# Patient Record
Sex: Male | Born: 1985 | Race: White | Hispanic: No | Marital: Married
Health system: Southern US, Community
[De-identification: ages and names within clinical notes are randomized; demographics above are authoritative.]

---
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
## 2004-08-18 ENCOUNTER — Emergency Department (HOSPITAL_COMMUNITY): Admission: EM | Admit: 2004-08-18 | Discharge: 2004-08-18 | Payer: Self-pay | Admitting: Emergency Medicine

## 2005-02-25 ENCOUNTER — Emergency Department (HOSPITAL_COMMUNITY): Admission: EM | Admit: 2005-02-25 | Discharge: 2005-02-25 | Payer: Self-pay | Admitting: Emergency Medicine

## 2005-07-24 ENCOUNTER — Emergency Department (HOSPITAL_COMMUNITY): Admission: EM | Admit: 2005-07-24 | Discharge: 2005-07-24 | Payer: Self-pay | Admitting: Emergency Medicine

## 2008-12-21 ENCOUNTER — Emergency Department (HOSPITAL_COMMUNITY): Admission: EM | Admit: 2008-12-21 | Discharge: 2008-12-21 | Payer: Self-pay | Admitting: Emergency Medicine

## 2009-01-07 ENCOUNTER — Encounter: Admission: RE | Admit: 2009-01-07 | Discharge: 2009-01-07 | Payer: Self-pay | Admitting: Family Medicine

## 2009-10-03 ENCOUNTER — Ambulatory Visit (HOSPITAL_COMMUNITY): Admission: EM | Admit: 2009-10-03 | Discharge: 2009-10-04 | Payer: Self-pay | Admitting: Emergency Medicine

## 2009-10-03 ENCOUNTER — Encounter (INDEPENDENT_AMBULATORY_CARE_PROVIDER_SITE_OTHER): Payer: Self-pay

## 2010-05-24 LAB — URINE MICROSCOPIC-ADD ON

## 2010-05-24 LAB — URINALYSIS, ROUTINE W REFLEX MICROSCOPIC
Bilirubin Urine: NEGATIVE
Glucose, UA: NEGATIVE mg/dL
Hgb urine dipstick: NEGATIVE
Ketones, ur: 15 mg/dL — AB
Nitrite: NEGATIVE
Urobilinogen, UA: 1 mg/dL (ref 0.0–1.0)
pH: 8.5 — ABNORMAL HIGH (ref 5.0–8.0)

## 2010-05-24 LAB — CBC
Hemoglobin: 14.7 g/dL (ref 13.0–17.0)
RBC: 4.72 MIL/uL (ref 4.22–5.81)
WBC: 14.8 10*3/uL — ABNORMAL HIGH (ref 4.0–10.5)

## 2010-05-24 LAB — COMPREHENSIVE METABOLIC PANEL
ALT: 20 U/L (ref 0–53)
Albumin: 4.4 g/dL (ref 3.5–5.2)
Chloride: 106 mEq/L (ref 96–112)
Potassium: 4.2 mEq/L (ref 3.5–5.1)
Sodium: 138 mEq/L (ref 135–145)

## 2010-05-24 LAB — DIFFERENTIAL
Basophils Relative: 0 % (ref 0–1)
Eosinophils Absolute: 0 10*3/uL (ref 0.0–0.7)
Eosinophils Relative: 0 % (ref 0–5)
Lymphocytes Relative: 10 % — ABNORMAL LOW (ref 12–46)
Monocytes Relative: 4 % (ref 3–12)
Neutrophils Relative %: 85 % — ABNORMAL HIGH (ref 43–77)

## 2010-06-12 LAB — POCT I-STAT, CHEM 8
Calcium, Ion: 1.07 mmol/L — ABNORMAL LOW (ref 1.12–1.32)
Creatinine, Ser: 1.2 mg/dL (ref 0.4–1.5)
Glucose, Bld: 93 mg/dL (ref 70–99)
HCT: 42 % (ref 39.0–52.0)
Hemoglobin: 14.3 g/dL (ref 13.0–17.0)
TCO2: 23 mmol/L (ref 0–100)

## 2010-06-12 LAB — CBC
HCT: 39.9 % (ref 39.0–52.0)
MCHC: 34.5 g/dL (ref 30.0–36.0)
MCV: 90.6 fL (ref 78.0–100.0)
Platelets: 194 10*3/uL (ref 150–400)
RBC: 4.41 MIL/uL (ref 4.22–5.81)
RDW: 13.1 % (ref 11.5–15.5)

## 2010-06-12 LAB — PROTIME-INR: INR: 1.04 (ref 0.00–1.49)

## 2012-05-01 IMAGING — CT CT ABD-PELV W/ CM
2 of 4 series · 17 of 46 positions shown, 19 images · IV contrast (APPLIED)
Comparison: None.

CLINICAL DATA: Epigastric pain.  Nausea vomiting since this a.m.

CT ABDOMEN AND PELVIS WITH CONTRAST
TECHNIQUE: Multidetector CT imaging of the abdomen and pelvis was
performed following the standard protocol during bolus
administration of intravenous contrast.
Contrast: 100  ml 3mnipaque-9MM

[Series 2: abd/pelv with 5.0 b31f st · axial · 0.69mm/px · z∈[-455,-70]mm · 14 of 85 slices shown, 16 images]
[im 4/85  soft-tissue]
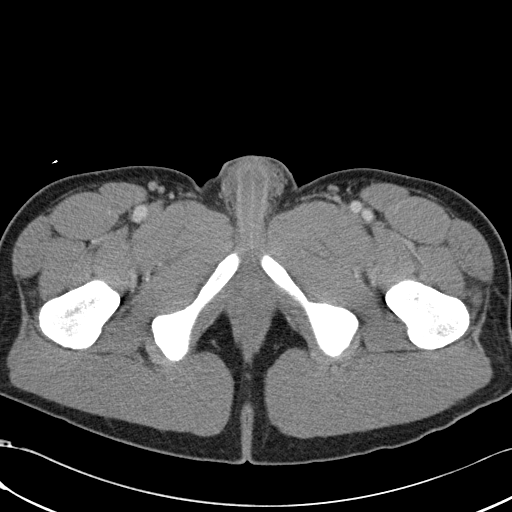
[im 4/85  bone]
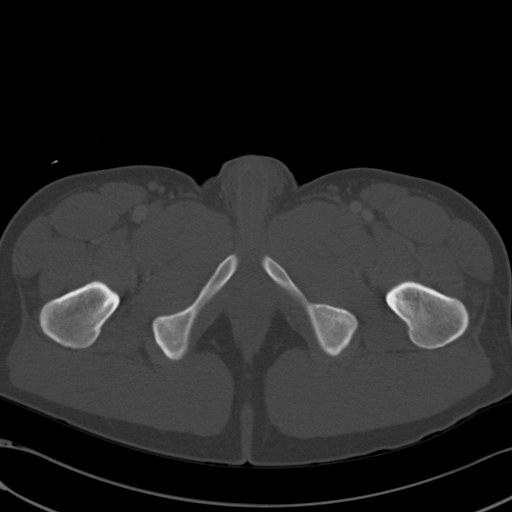
[im 11/85  soft-tissue]
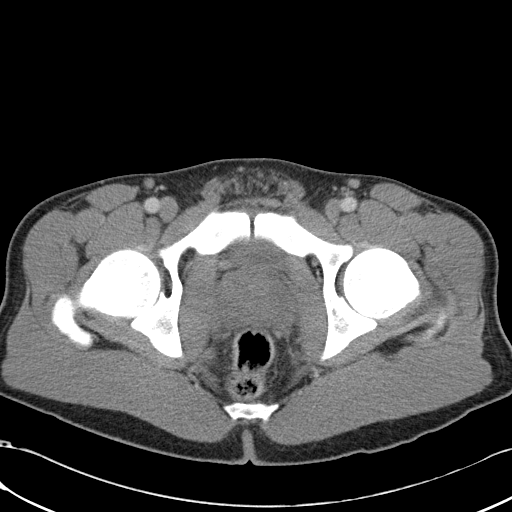
[im 17/85  soft-tissue]
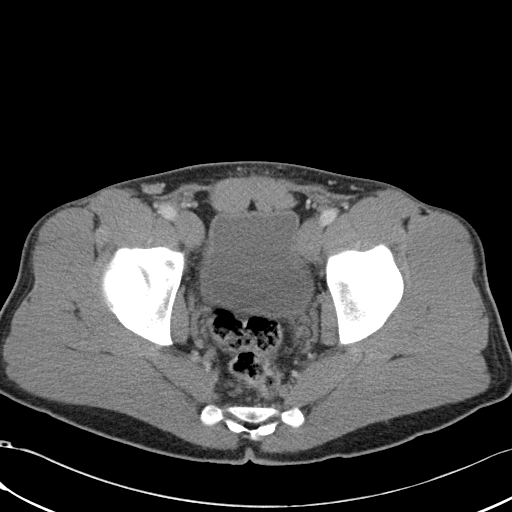
[im 24/85  soft-tissue]
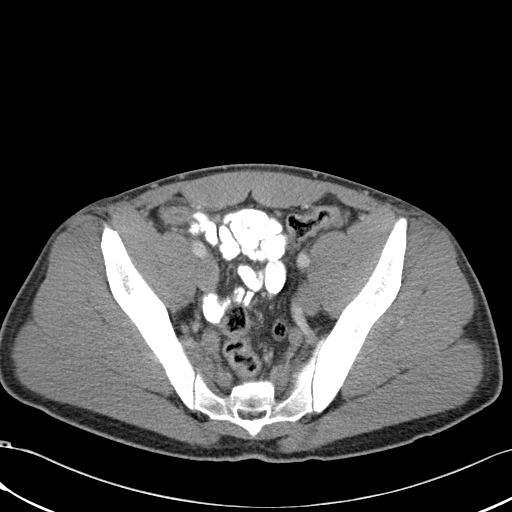
[im 27/85  soft-tissue]
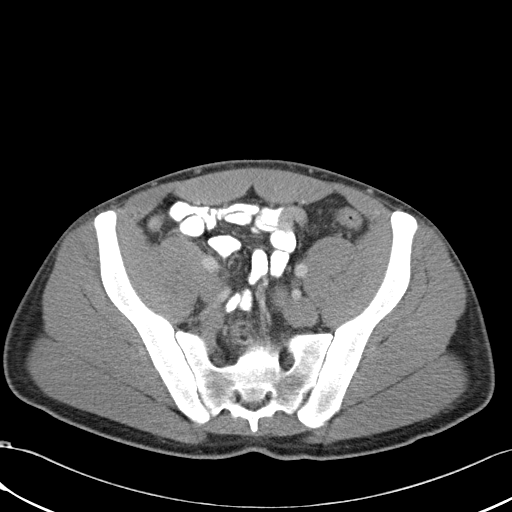
[im 34/85  soft-tissue]
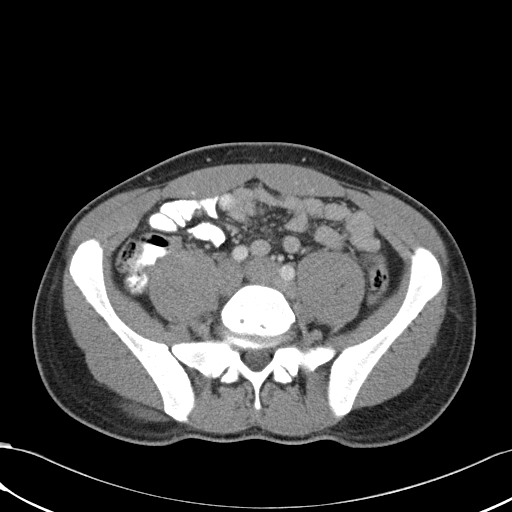
[im 41/85  soft-tissue]
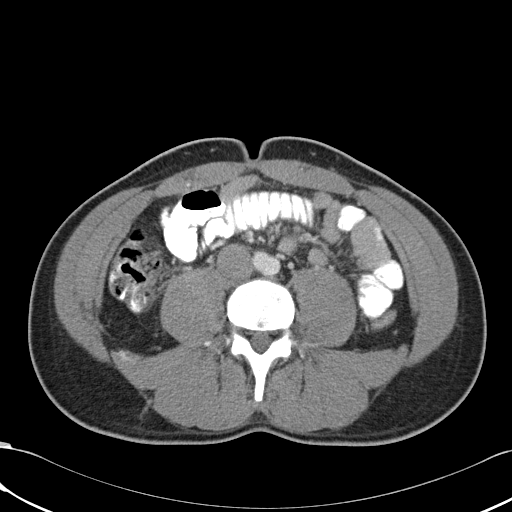
[im 44/85  soft-tissue]
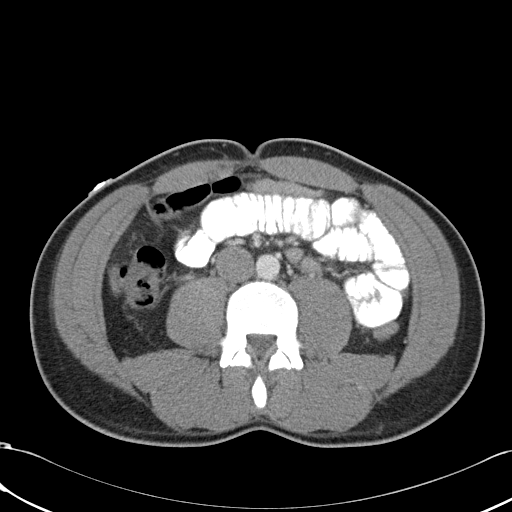
[im 51/85  soft-tissue]
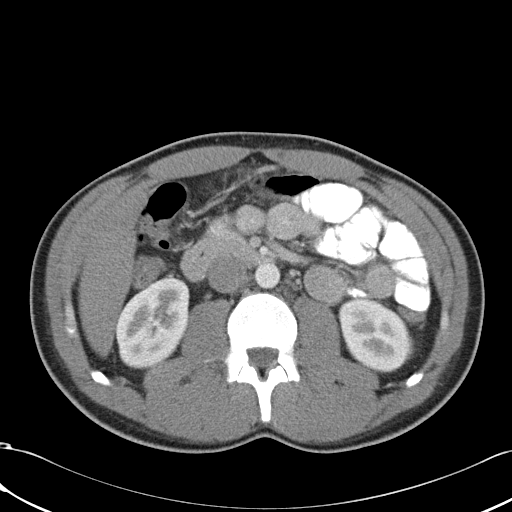
[im 51/85  bone]
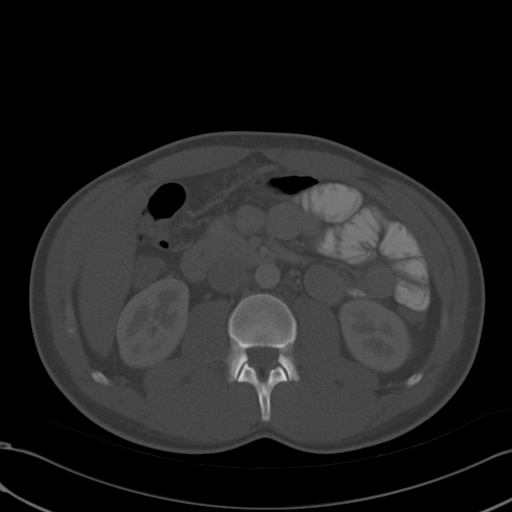
[im 58/85  soft-tissue]
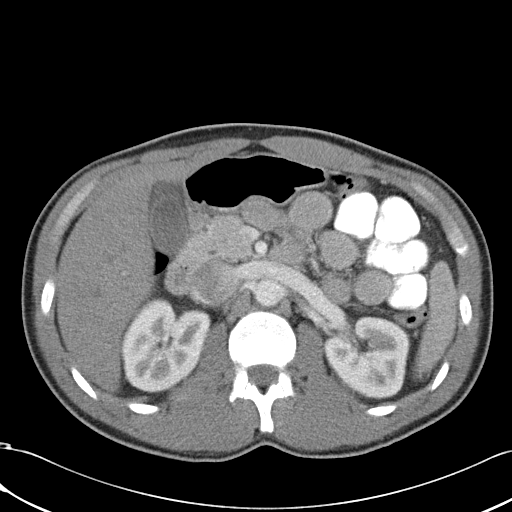
[im 64/85  soft-tissue]
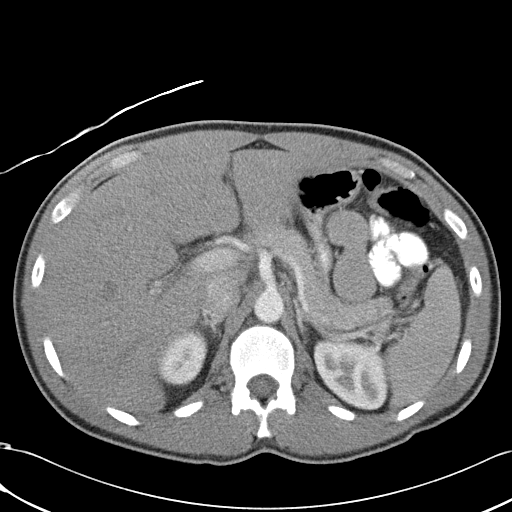
[im 68/85  soft-tissue]
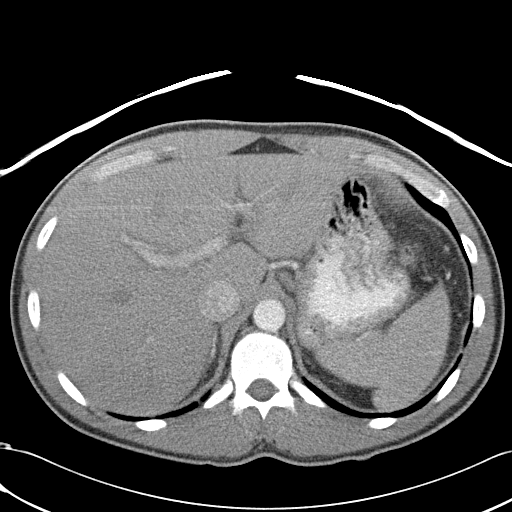
[im 74/85  soft-tissue]
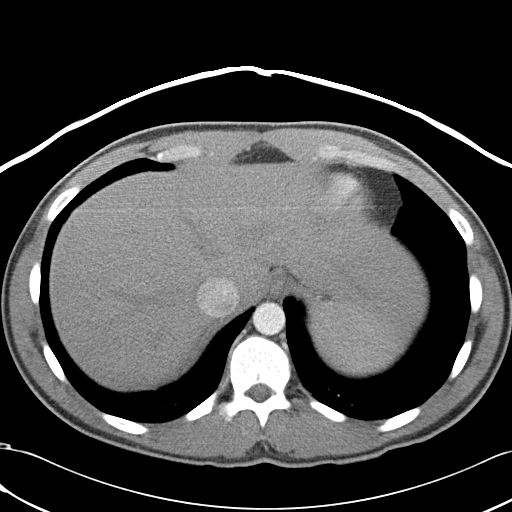
[im 81/85  soft-tissue]
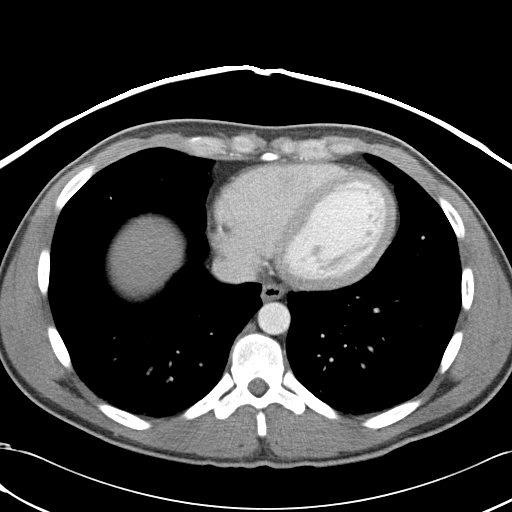

[Series 5: abd/pelv with 2.0 spo cor st · coronal · 0.83mm/px · 3 of 101 slices shown]
[im 34/101  soft-tissue]
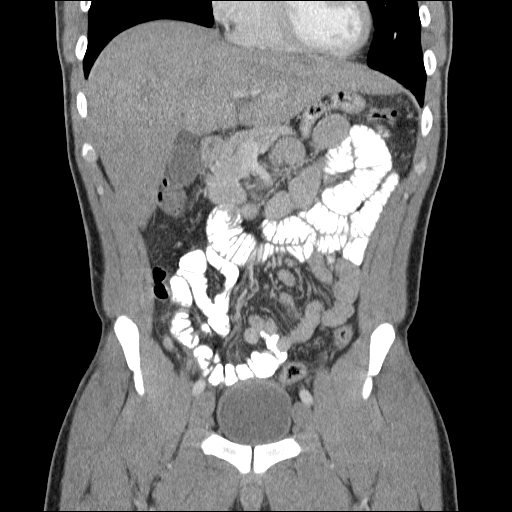
[im 45/101  soft-tissue]
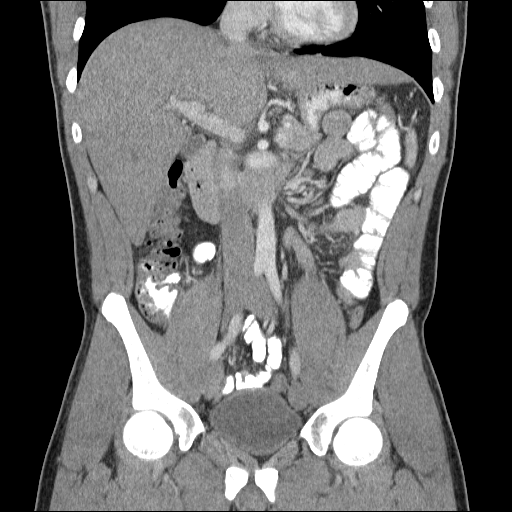
[im 56/101  soft-tissue]
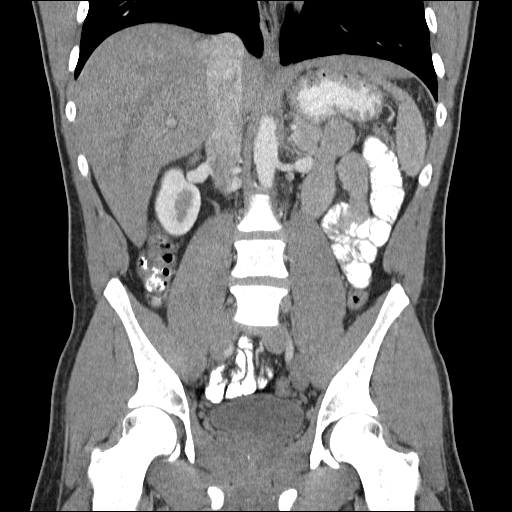

[17 of 46 positions shown; findings below may reference images not displayed]

FINDINGS: Mild bibasilar subsegmental atelectasis. Normal heart
size without pericardial or pleural effusion.  Normal liver,
spleen, stomach, pancreas.  Gallbladder wall thickening up to 9 mm
on sagittal image 32.   No definite pericholecystic edema or
biliary ductal dilatation.  Normal adrenal glands and kidneys. No
retroperitoneal or retrocrural adenopathy.

Normal colon and terminal ileum.  The appendix is relatively normal
proximally but thickened and edematous distally with mild
surrounding edema.  Example image 29 coronal and image 62
transverse.  There is no surrounding abscess or free perforation.

Normal small bowel loops. No pelvic adenopathy.    Normal urinary
bladder and prostate.  Free pelvic cul-de-sac fluid is small in
volume on image 71 and likely secondary.  Bilateral L5 pars defects
without malalignment.
IMPRESSION: 1.  Findings of acute appendicitis, uncomplicated. Critical test
results telephoned to Tulsie, Rameion at the time of interpretation at
[DATE] p.m. on 10/03/2009.
2.  Gallbladder wall thickening of indeterminate etiology.  No
definite surrounding edema.  Correlate with right upper quadrant
symptoms.
3.  Trace free pelvic fluid, likely secondary to the appendicitis.

## 2021-10-22 ENCOUNTER — Emergency Department (HOSPITAL_COMMUNITY): Payer: Self-pay

## 2021-10-22 ENCOUNTER — Emergency Department (HOSPITAL_COMMUNITY)
Admission: EM | Admit: 2021-10-22 | Discharge: 2021-10-22 | Disposition: A | Discharge status: Home / Self Care | Payer: Self-pay | Attending: Emergency Medicine | Admitting: Emergency Medicine

## 2021-10-22 ENCOUNTER — Encounter (HOSPITAL_COMMUNITY): Payer: Self-pay

## 2021-10-22 DIAGNOSIS — Z20822 Contact with and (suspected) exposure to covid-19: Secondary | ICD-10-CM | POA: Insufficient documentation

## 2021-10-22 DIAGNOSIS — J181 Lobar pneumonia, unspecified organism: Secondary | ICD-10-CM | POA: Insufficient documentation

## 2021-10-22 DIAGNOSIS — R110 Nausea: Secondary | ICD-10-CM | POA: Insufficient documentation

## 2021-10-22 DIAGNOSIS — R197 Diarrhea, unspecified: Secondary | ICD-10-CM | POA: Insufficient documentation

## 2021-10-22 DIAGNOSIS — J189 Pneumonia, unspecified organism: Secondary | ICD-10-CM

## 2021-10-22 LAB — BASIC METABOLIC PANEL
Anion gap: 10 (ref 5–15)
BUN: 16 mg/dL (ref 6–20)
CO2: 22 mmol/L (ref 22–32)
Calcium: 9.2 mg/dL (ref 8.9–10.3)
Chloride: 101 mmol/L (ref 98–111)
Creatinine, Ser: 1.24 mg/dL (ref 0.61–1.24)
GFR, Estimated: >60 mL/min (ref >60)
Glucose, Bld: 133 mg/dL — ABNORMAL HIGH (ref 70–99)
Potassium: 3.9 mmol/L (ref 3.5–5.1)
Sodium: 133 mmol/L — ABNORMAL LOW (ref 135–145)

## 2021-10-22 LAB — CBC WITH DIFFERENTIAL/PLATELET
Abs Immature Granulocytes: 0.03 10*3/uL (ref 0.00–0.07)
Basophils Absolute: 0 10*3/uL (ref 0.0–0.1)
Basophils Relative: 0 %
Eosinophils Absolute: 0 10*3/uL (ref 0.0–0.5)
Eosinophils Relative: 0 %
HCT: 41.1 % (ref 39.0–52.0)
Hemoglobin: 14.2 g/dL (ref 13.0–17.0)
Immature Granulocytes: 0 %
Lymphocytes Relative: 10 %
Lymphs Abs: 1 10*3/uL (ref 0.7–4.0)
MCH: 30.8 pg (ref 26.0–34.0)
MCHC: 34.5 g/dL (ref 30.0–36.0)
MCV: 89.2 fL (ref 80.0–100.0)
Monocytes Absolute: 1.1 10*3/uL — ABNORMAL HIGH (ref 0.1–1.0)
Monocytes Relative: 11 %
Neutro Abs: 7.8 10*3/uL — ABNORMAL HIGH (ref 1.7–7.7)
Neutrophils Relative %: 79 %
Platelets: 214 10*3/uL (ref 150–400)
RBC: 4.61 MIL/uL (ref 4.22–5.81)
RDW: 12.5 % (ref 11.5–15.5)
WBC: 9.9 10*3/uL (ref 4.0–10.5)
nRBC: 0 % (ref 0.0–0.2)

## 2021-10-22 LAB — RESP PANEL BY RT-PCR (FLU A&B, COVID) ARPGX2
Influenza A by PCR: NEGATIVE
Influenza B by PCR: NEGATIVE
SARS Coronavirus 2 by RT PCR: NEGATIVE

## 2021-10-22 MED ORDER — DOXYCYCLINE HYCLATE 100 MG PO CAPS: 100.0000 mg | ORAL_CAPSULE | Freq: Two times a day (BID) | ORAL | 0 refills | Status: AC

## 2021-10-22 MED ORDER — SODIUM CHLORIDE 0.9 % IV SOLN
1.0000 g | Freq: Once | INTRAVENOUS | Status: AC
  Administered 2021-10-22: 1 g via INTRAVENOUS
  Filled 2021-10-22: qty 10

## 2021-10-22 MED ORDER — KETOROLAC TROMETHAMINE 30 MG/ML IJ SOLN
15.0000 mg | Freq: Once | INTRAMUSCULAR | Status: AC
  Administered 2021-10-22: 15 mg via INTRAMUSCULAR
  Filled 2021-10-22: qty 1

## 2021-10-22 MED ORDER — ACETAMINOPHEN 325 MG PO TABS
650.0000 mg | ORAL_TABLET | Freq: Once | ORAL | Status: DC
  Filled 2021-10-22: qty 2

## 2021-10-22 MED ORDER — ACETAMINOPHEN 500 MG PO TABS
500.0000 mg | ORAL_TABLET | Freq: Once | ORAL | Status: AC
  Administered 2021-10-22: 500 mg via ORAL
  Filled 2021-10-22: qty 1

## 2021-10-22 MED ORDER — DOXYCYCLINE HYCLATE 100 MG PO TABS
100.0000 mg | ORAL_TABLET | Freq: Once | ORAL | Status: AC
  Administered 2021-10-22: 100 mg via ORAL
  Filled 2021-10-22: qty 1

## 2021-10-22 MED ORDER — SODIUM CHLORIDE 0.9 % IV BOLUS
1000.0000 mL | Freq: Once | INTRAVENOUS | Status: AC
  Administered 2021-10-22: 1000 mL via INTRAVENOUS

## 2021-10-22 NOTE — ED Provider Notes (Signed)
[facility]-EMERGENCY DEPT Provider Note   CSN: [id number] Arrival date & time: 10/22/21  1148     History  Chief Complaint  Patient presents with   Fever    Casey Holmes is a 36 y.o. male.  With no significant past medical history presents to the ED for evaluation of persistent fevers for the past 6 days, cough, shortness of breath.  He states that multiple people in his household have tested positive for COVID within the past week.  He states he has been taking Tylenol 650 mg every 8 hours, but has not been able to keep his temperature under control.  Reports fevers of approximately 102 Fahrenheit at home.  Tylenol has not been effective in reducing this 100.4 Fahrenheit.  Also reports fatigue.  Cough is productive of clear sputum.  Denies chest pain or hemoptysis.  Also reports nausea with 1 bout of emesis 3 days ago.  No hematemesis.  Also reports persistent diarrhea, no increase in frequency, no melena or hematochezia.   Fever Associated symptoms: chills, cough, diarrhea and nausea   Associated symptoms: no chest pain and no vomiting        Home Medications Prior to Admission medications   Not on File      Allergies    Patient has no known allergies.    Review of Systems   Review of Systems  Constitutional:  Positive for chills, fatigue and fever.  Respiratory:  Positive for cough and shortness of breath. Negative for wheezing.   Cardiovascular:  Negative for chest pain and palpitations.  Gastrointestinal:  Positive for diarrhea and nausea. Negative for abdominal pain, blood in stool, constipation and vomiting.  Genitourinary:  Negative for difficulty urinating.  All other systems reviewed and are negative.   Physical Exam Updated Vital Signs BP 105/88   Pulse 94   Temp 99.9 F (37.7 C) (Oral)   Resp 16   SpO2 96%  Physical Exam Vitals and nursing note reviewed.  Constitutional:      Appearance: He is well-developed.     Comments:  Flushed appearing, mild distress  HENT:     Head: Normocephalic and atraumatic.  Eyes:     Extraocular Movements: Extraocular movements intact.     Conjunctiva/sclera: Conjunctivae normal.     Pupils: Pupils are equal, round, and reactive to light.  Cardiovascular:     Rate and Rhythm: Normal rate and regular rhythm.     Heart sounds: No murmur heard. Pulmonary:     Effort: No respiratory distress.     Breath sounds: Normal breath sounds.     Comments: Persistent cough, no adventitious breath sounds Abdominal:     Palpations: Abdomen is soft.     Tenderness: There is no abdominal tenderness.  Musculoskeletal:        General: No swelling.     Cervical back: Neck supple.  Skin:    General: Skin is warm and dry.     Capillary Refill: Capillary refill takes less than 2 seconds.     Comments: Flushed, sweating without diaphoresis  Neurological:     Mental Status: He is alert.  Psychiatric:        Mood and Affect: Mood normal.    ED Results / Procedures / Treatments   Labs (all labs ordered are listed, but only abnormal results are displayed) Labs Reviewed  BASIC METABOLIC PANEL - Abnormal; Notable for the following components:      Result Value   Sodium 133 (*)  Glucose, Bld 133 (*)    All other components within normal limits  CBC WITH DIFFERENTIAL/PLATELET - Abnormal; Notable for the following components:   Neutro Abs 7.8 (*)    Monocytes Absolute 1.1 (*)    All other components within normal limits  RESP PANEL BY RT-PCR (FLU A&B, COVID) [id number]    EKG None  Radiology DG Chest 1 View  Result Date: 10/22/2021 CLINICAL DATA:  Short of breath. Fever. Nausea, vomiting and diarrhea for 6 days. EXAM: CHEST  1 VIEW COMPARISON:  12/21/2008 FINDINGS: Heart size and mediastinal contours appear normal. No signs of pleural effusion or edema. There is a large area of airspace consolidation within the left upper lobe. Right lung appears clear. Osseous structures are unremarkable.  IMPRESSION: 1. Left upper lobe airspace consolidation compatible with pneumonia. Followup PA and lateral chest X-ray is recommended in 3-4 weeks following trial of antibiotic therapy to ensure resolution and exclude underlying malignancy. Electronically Signed   By: Signa Kell M.D.   On: 10/22/2021 13:05    Procedures Procedures    Medications Ordered in ED Medications  sodium chloride 0.9 % bolus 1,000 mL (1,000 mLs Intravenous New Bag/Given 10/22/21 1623)  acetaminophen (TYLENOL) tablet 500 mg (500 mg Oral Given 10/22/21 1625)  ketorolac (TORADOL) 30 MG/ML injection 15 mg (15 mg Intramuscular Given 10/22/21 1625)  cefTRIAXone (ROCEPHIN) 1 g in sodium chloride 0.9 % 100 mL IVPB (1 g Intravenous New Bag/Given 10/22/21 1623)  doxycycline (VIBRA-TABS) tablet 100 mg (100 mg Oral Given 10/22/21 1624)    ED Course/ Medical Decision Making/ A&P Clinical Course as of 10/22/21 1807  Wed Oct 22, 2021  1759 DG Chest 1 View I personally reviewed and interpreted the images.  Left upper lobe pneumonia.  No other acute cardiopulmonary abnormality [AS]  1759 CBC with Differential(!) CBC normal, no leukocytosis. [AS]  1759 Basic metabolic panel(!) BMP normal, mild hyponatremia of 133. [AS]    Clinical Course User Index [AS] Moritz Lever, Edsel Petrin, PA-C                           Medical Decision Making This patient presents to the ED for concern of fever, cough, shortness of breath, this involves an extensive number of treatment options, and is a complaint that carries with it a high risk of complications and morbidity.  Differential diagnosis for emergent cause of cough includes but is not limited to upper respiratory infection, lower respiratory infection, allergies, asthma, irritants, foreign body, medications such as ACE inhibitors, reflux, asthma, CHF, lung cancer, interstitial lung disease, psychiatric causes, postnasal drip and postinfectious bronchospasm.     Co morbidities that complicate the  patient evaluation       Known exposure to COVID  My initial workup includes basic labs, chest x-ray  Additional history obtained from: Nursing notes from this visit.  I ordered, reviewed and interpreted labs which include: CBC, BMP, respiratory panel.  All labs within normal limits.   I ordered imaging studies including chest x-ray I independently visualized and interpreted imaging which showed left upper lobe pneumonia.  No other cardiopulmonary abnormality. I agree with the radiologist interpretation  Cardiac Monitoring:       The patient was not maintained on a cardiac monitor.    Consultations Obtained:  No consultations  Afebrile and hemodynamically stable at the time of discharge.  Walk test showed a drop of SPO2 to 94%.  Asymptomatic during the test.  Patient was given a  dose of Rocephin IV and p.o. doxycycline along with a liter of fluids, tylenol, ibuprofen while in the ED.  He reports significant relief.  Patient will be started on outpatient p.o. doxycycline for community-acquired left upper lobe pneumonia.  Patient was educated on photosensitivity from doxycycline and told not to expose himself to the sun while on this medication.  Patient was educated that he should not return to work until symptoms have resolved.  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  At this time there does not appear to be any evidence of an acute emergency medical condition and the patient appears stable for discharge with appropriate outpatient follow up. Diagnosis was discussed with patient who verbalizes understanding of care plan and is agreeable to discharge. I have discussed return precautions with patient and wife who verbalizes understanding. Patient encouraged to follow-up with their PCP within 1 week. All questions answered.  Patient's case discussed with Dr. Langston Masker who agrees with plan to discharge with follow-up.   Note:  Portions of this report may have been transcribed using voice recognition software. Every effort was made to ensure accuracy; however, inadvertent computerized transcription errors may still be present.   Amount and/or Complexity of Data Reviewed Labs: ordered. Radiology: ordered.  Risk Prescription drug management.          Final Clinical Impression(s) / ED Diagnoses Final diagnoses:  Community acquired pneumonia of left upper lobe of lung    Rx / DC Orders ED Discharge Orders     None         Roylene Reason, Hershal Coria 10/22/21 1847    Wyvonnia Dusky, MD 10/23/21 [phone]

## 2021-10-22 NOTE — ED Notes (Signed)
Patient ambulated well; O2 dropped to 94%

## 2021-10-22 NOTE — ED Triage Notes (Signed)
Pt arrived via POV, c/o fevers (102 at home) not being controlled with OTC meds, n/v and diarrhea x6 days. States multiple sick contacts.

## 2021-10-22 NOTE — ED Provider Triage Note (Signed)
Emergency Medicine Provider Triage Evaluation Note  Casey Holmes , a 36 y.o. male  was evaluated in triage.  Pt complains of cough, shortness of breath, diarrhea for 5 days.  States everyone in his house has been diagnosed with COVID since onset of symptoms.  Review of Systems  Positive: Cough, shortness of breath, fevers Negative: Emesis, hematemesis, melena, hematochezia, abdominal pain  Physical Exam  BP (!) 121/91   Pulse (!) 101   Temp 99.2 F (37.3 C) (Oral)   Resp 16   SpO2 93%  Gen:   Awake, no distress, not ill appearing Resp:  Normal effort, lungs CTA B MSK:   Moves extremities without difficulty  Other:    Medical Decision Making  Medically screening exam initiated at 12:34 PM.  Appropriate orders placed.  Casey Holmes was informed that the remainder of the evaluation will be completed by another provider, this initial triage assessment does not replace that evaluation, and the importance of remaining in the ED until their evaluation is complete.     Michelle Piper, [place] 10/22/21 1242

## 2021-10-22 NOTE — Discharge Instructions (Addendum)
You have been seen today for your complaint of fevers, cough and shortness of breath. Your lab work was reassuring and showed no abnormalities. Your imaging showed a left upper lobe pneumonia. Your discharge medications include doxycycline.  This is an antibiotic.  You should take a in the morning and at night for the entire duration of the prescription.. Home care instructions are as follows:  You should continue to push fluids.  You may start with a low residue diet including soups, bananas, rice and other easily digestible foods. Follow up with: Your primary care provider within 1 week. Please seek immediate medical care if you develop any of the following symptoms: Your shortness of breath becomes worse. Your chest pain increases. Your sickness becomes worse, especially if you are an older adult or have a weak immune system. You cough up blood.  At this time there does not appear to be the presence of an emergent medical condition, however there is always the potential for conditions to change. Please read and follow the below instructions.  Do not take your medicine if  develop an itchy rash, swelling in your mouth or lips, or difficulty breathing; call 911 and seek immediate emergency medical attention if this occurs.  You may review your lab tests and imaging results in their entirety on your MyChart account.  Please discuss all results of fully with your primary care provider and other specialist at your follow-up visit.  Note: Portions of this text may have been transcribed using voice recognition software. Every effort was made to ensure accuracy; however, inadvertent computerized transcription errors may still be present.
